# Patient Record
Sex: Male | Born: 1935 | Race: White | Hispanic: No
Health system: Southern US, Community
[De-identification: ages and names within clinical notes are randomized; demographics above are authoritative.]

---

## 2004-11-01 ENCOUNTER — Other Ambulatory Visit: Payer: Self-pay

## 2004-11-01 ENCOUNTER — Observation Stay: Payer: Self-pay | Admitting: Internal Medicine

## 2009-08-01 ENCOUNTER — Ambulatory Visit: Payer: Self-pay | Admitting: Family Medicine

## 2009-08-10 ENCOUNTER — Ambulatory Visit (HOSPITAL_BASED_OUTPATIENT_CLINIC_OR_DEPARTMENT_OTHER): Admission: RE | Admit: 2009-08-10 | Discharge: 2009-08-10 | Payer: Self-pay | Admitting: Orthopedic Surgery

## 2010-06-20 LAB — BASIC METABOLIC PANEL
BUN: 13 mg/dL (ref 6–23)
Chloride: 96 mEq/L (ref 96–112)
Glucose, Bld: 161 mg/dL — ABNORMAL HIGH (ref 70–99)
Potassium: 4.6 mEq/L (ref 3.5–5.1)

## 2010-11-05 IMAGING — US US EXTREM LOW VENOUS*L*
1 series · 17 of 24 positions shown · non-contrast
Comparison: none

REASON FOR EXAM: CR [id number] DR DISNEY CELL L LEG SWELLING EVAL FOR DVT
COMMENTS:

PROCEDURE:     US  - US DOPPLER LOW EXTR LEFT  - August 01, 2009  [DATE]
RESULT:     Technique: Grayscale, duplex, color flow and SPECTRAL waveform
imaging was performed to interrogate the deep venous structures of the right
and left lower extremities.

[Series 1: us extrem low venous*left* · 17 of 30 slices shown]
[im 1/30]
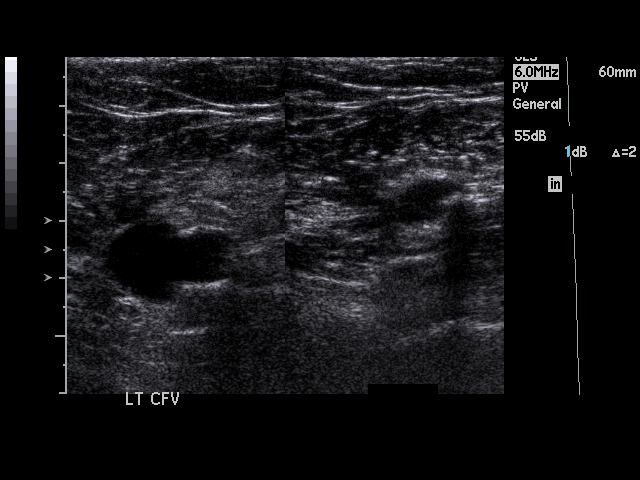
[im 3/30]
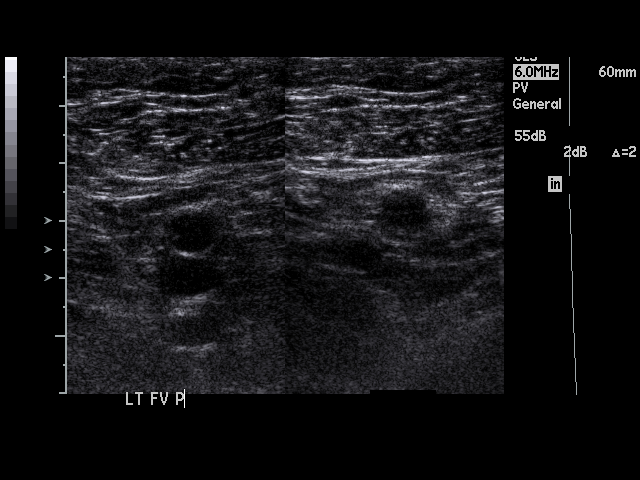
[im 4/30]
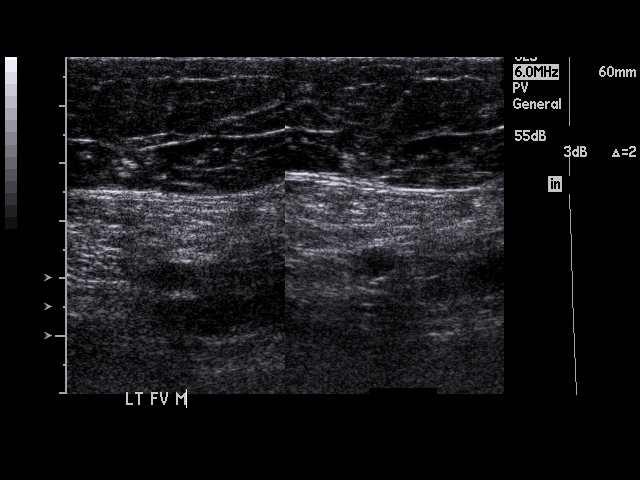
[im 6/30]
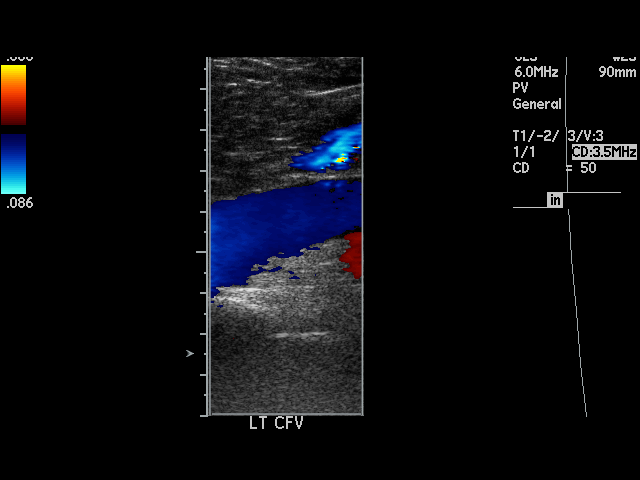
[im 8/30]
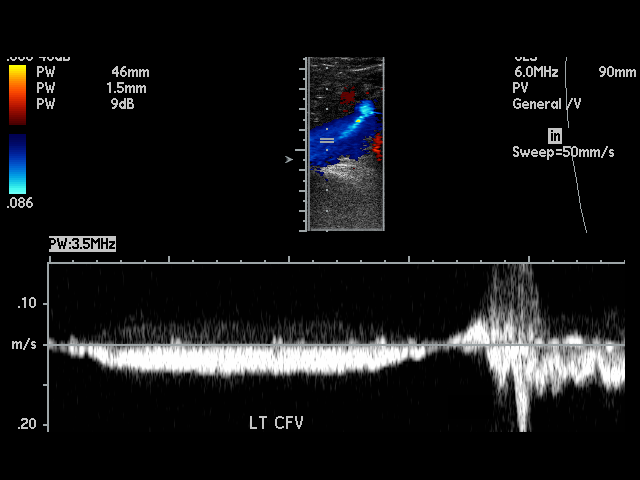
[im 9/30]
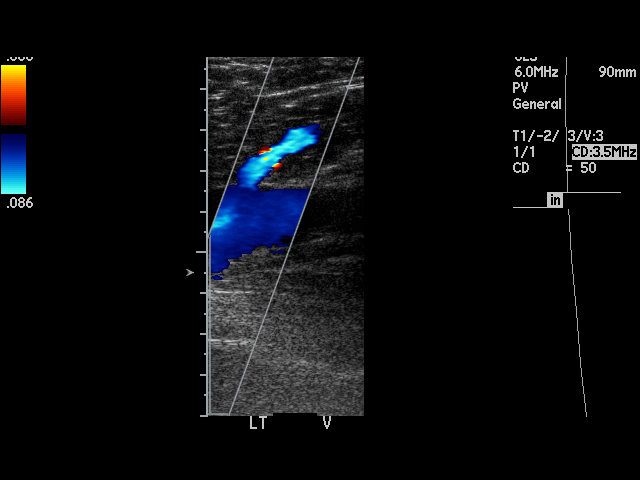
[im 12/30]
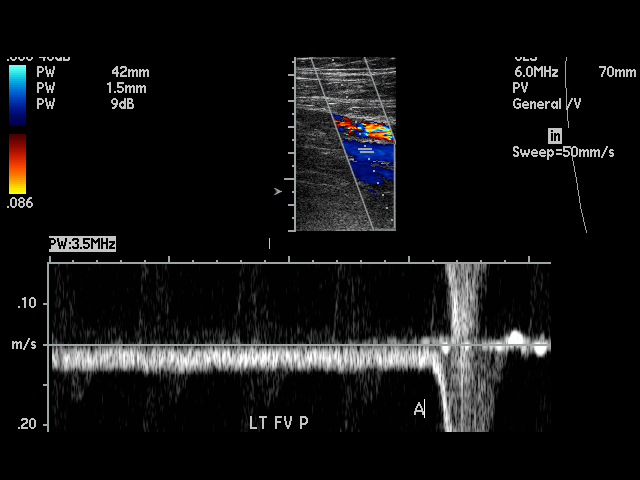
[im 13/30]
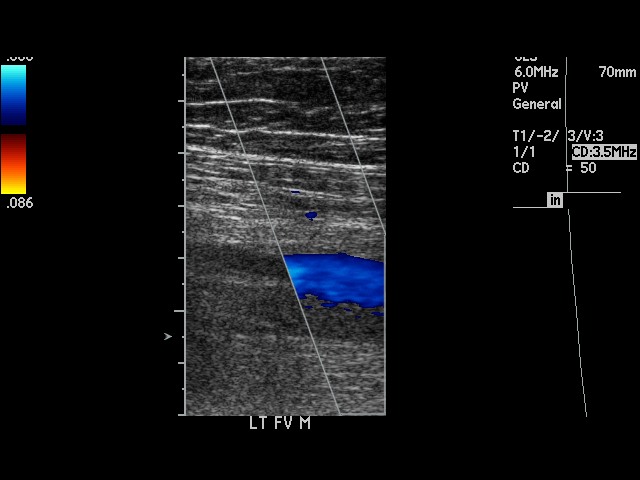
[im 16/30]
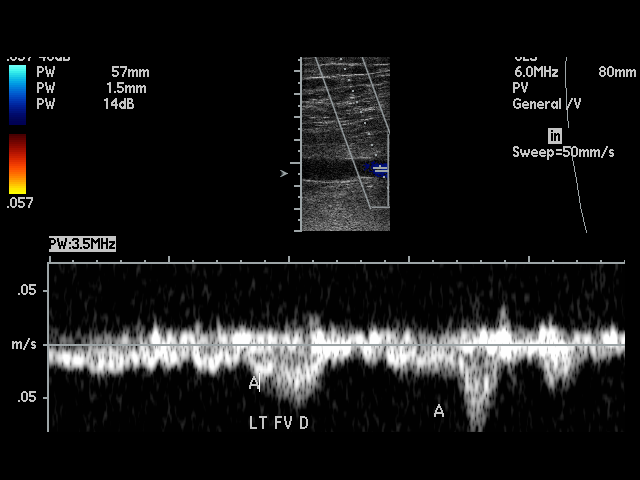
[im 17/30]
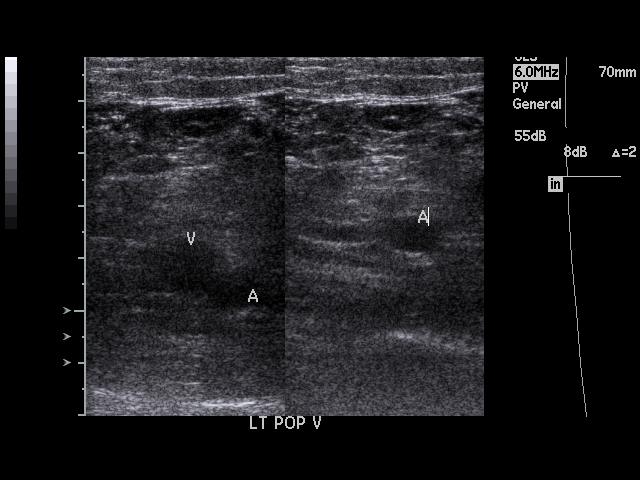
[im 18/30]
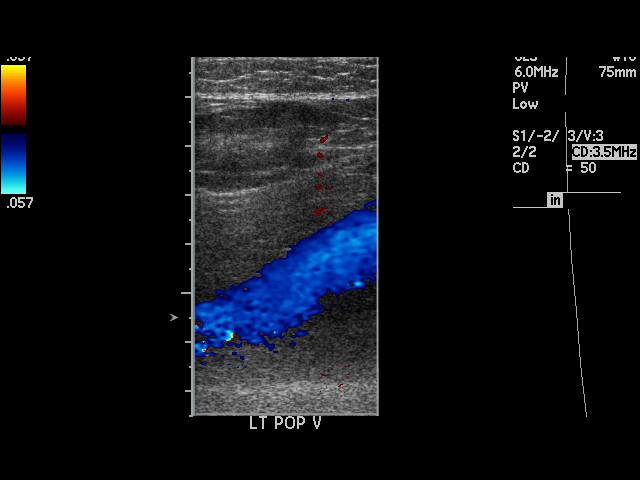
[im 21/30]
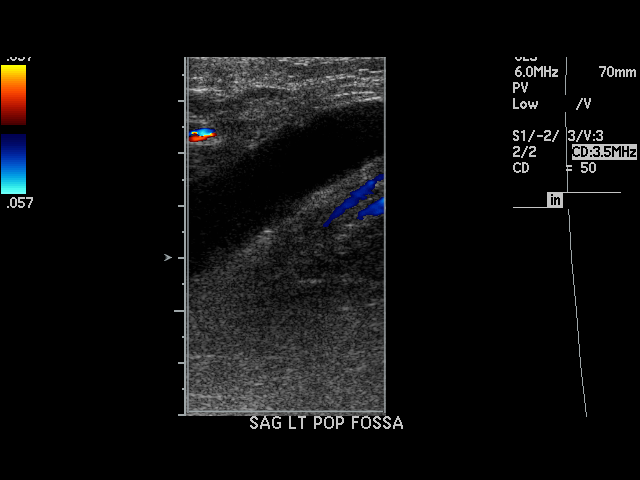
[im 22/30]
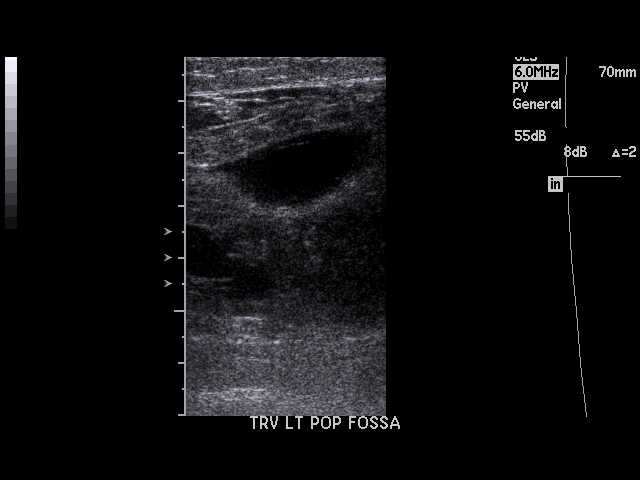
[im 24/30]
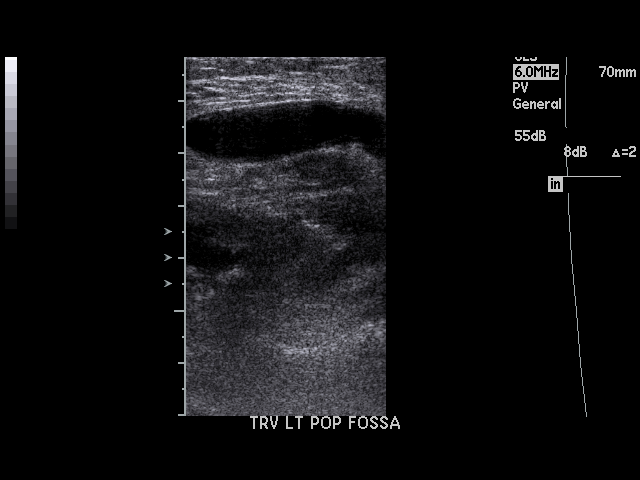
[im 26/30]
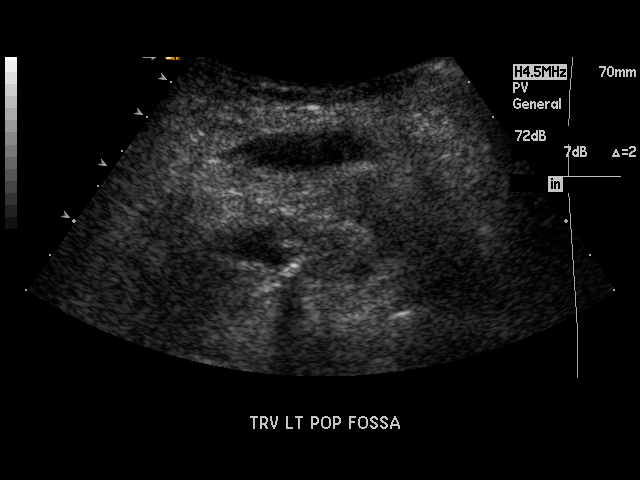
[im 27/30]
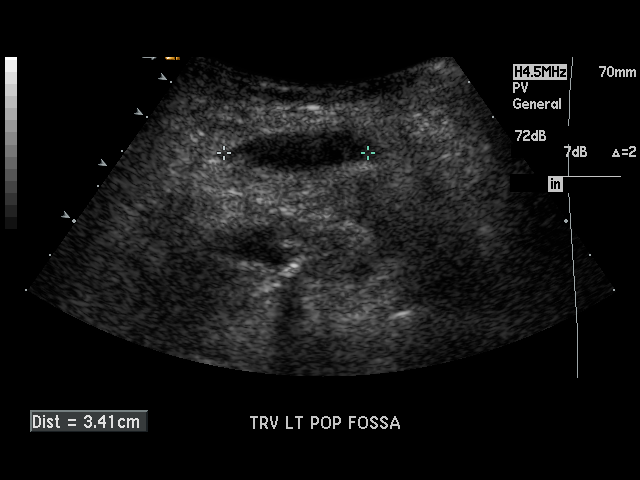
[im 30/30]
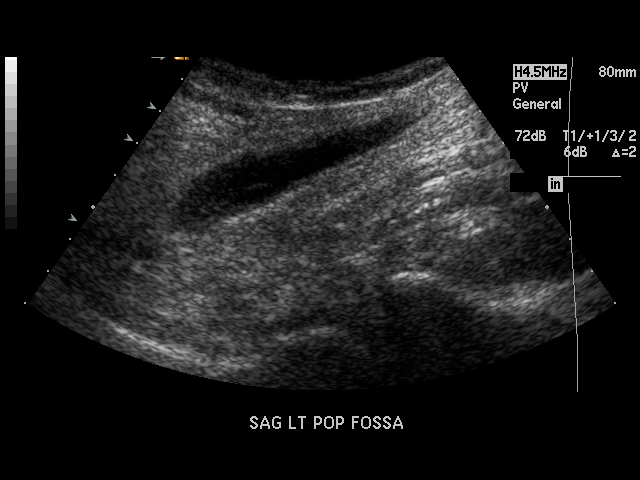

[17 of 24 positions shown; findings below may reference images not displayed]

FINDINGS: There is no evidence of increased echogenicity, non-compressibility,
abnormal waveforms nor abnormal color flow within the interrogated deep
venous structures of the right or left lower extremity. There is an
appropriate response to Valsalva and augmentation.

Left popliteal fossa cyst measuring 6.6 x 3.9 x 1.6 centimeters
IMPRESSION: 1.  No sonographic evidence of a deep venous thrombus within the right or
left lower extremity.

## 2012-05-22 ENCOUNTER — Ambulatory Visit: Payer: Self-pay | Admitting: Specialist

## 2013-01-08 ENCOUNTER — Ambulatory Visit: Payer: Self-pay | Admitting: Family Medicine

## 2014-04-14 IMAGING — CR CERVICAL SPINE - 2-3 VIEW
1 series · 5 of 5 positions shown · non-contrast
Comparison: none

REASON FOR EXAM: Neck pain
COMMENTS:

[Series 1: lat · 0.17mm/px · 5 of 5 slices shown]
[im 1/5]
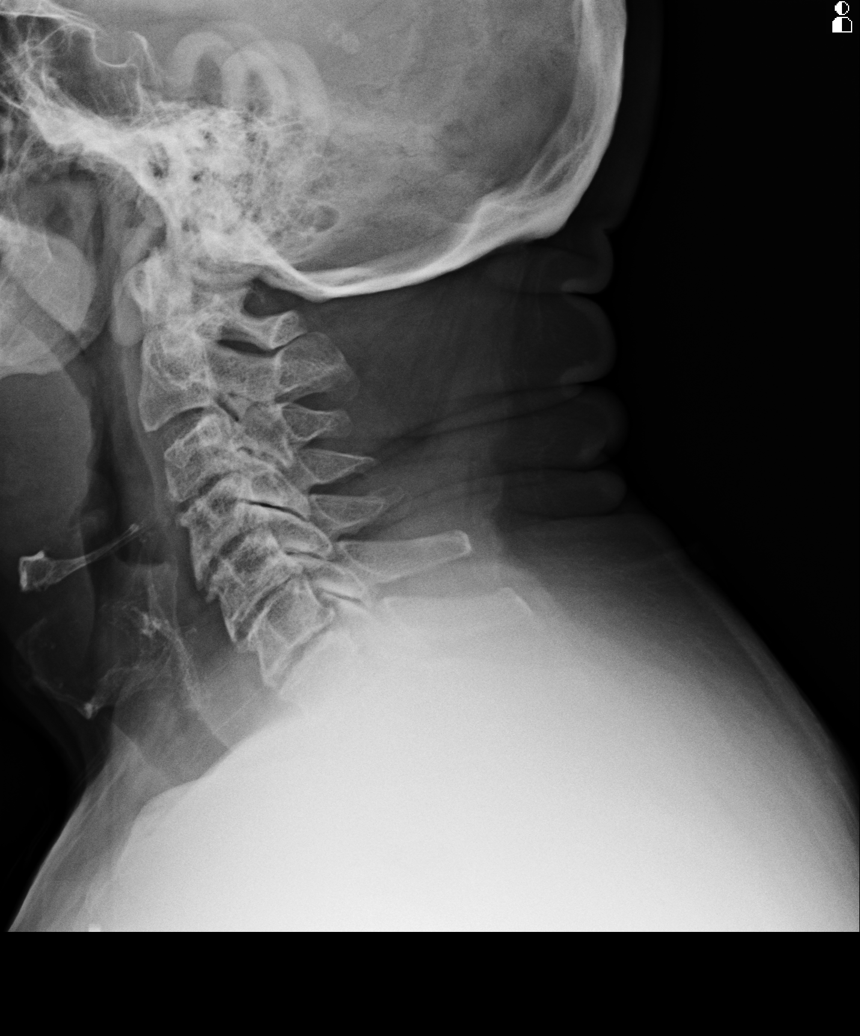
[im 2/5]
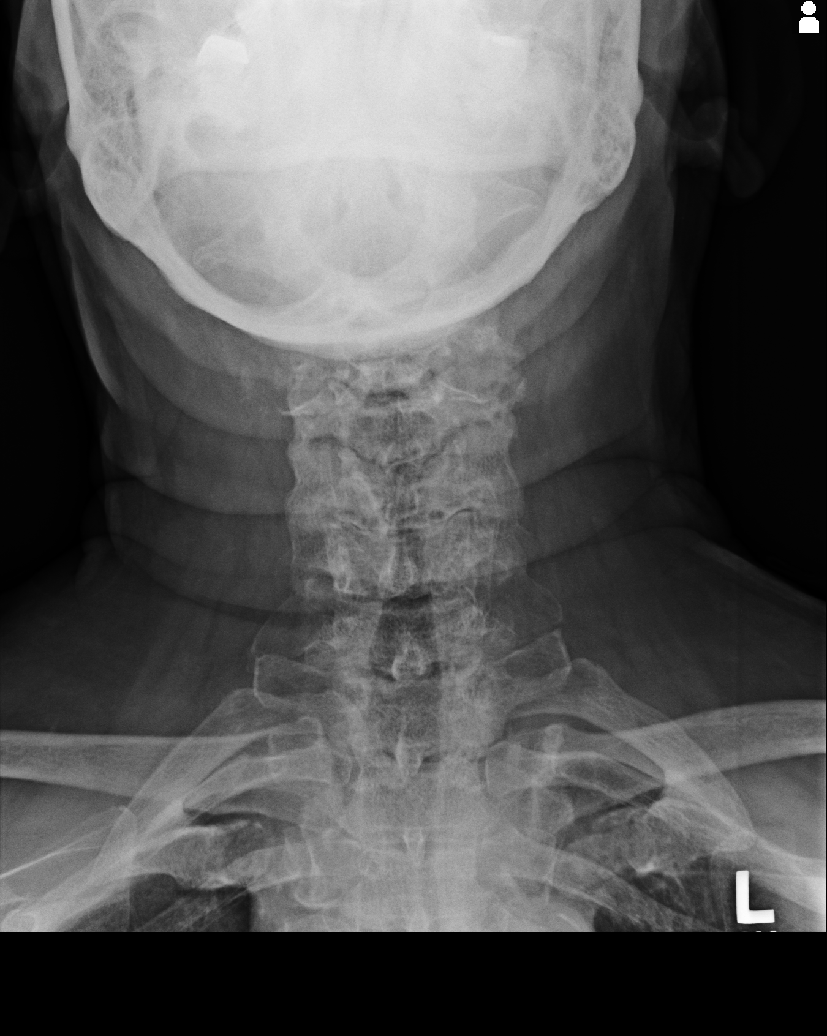
[im 3/5]
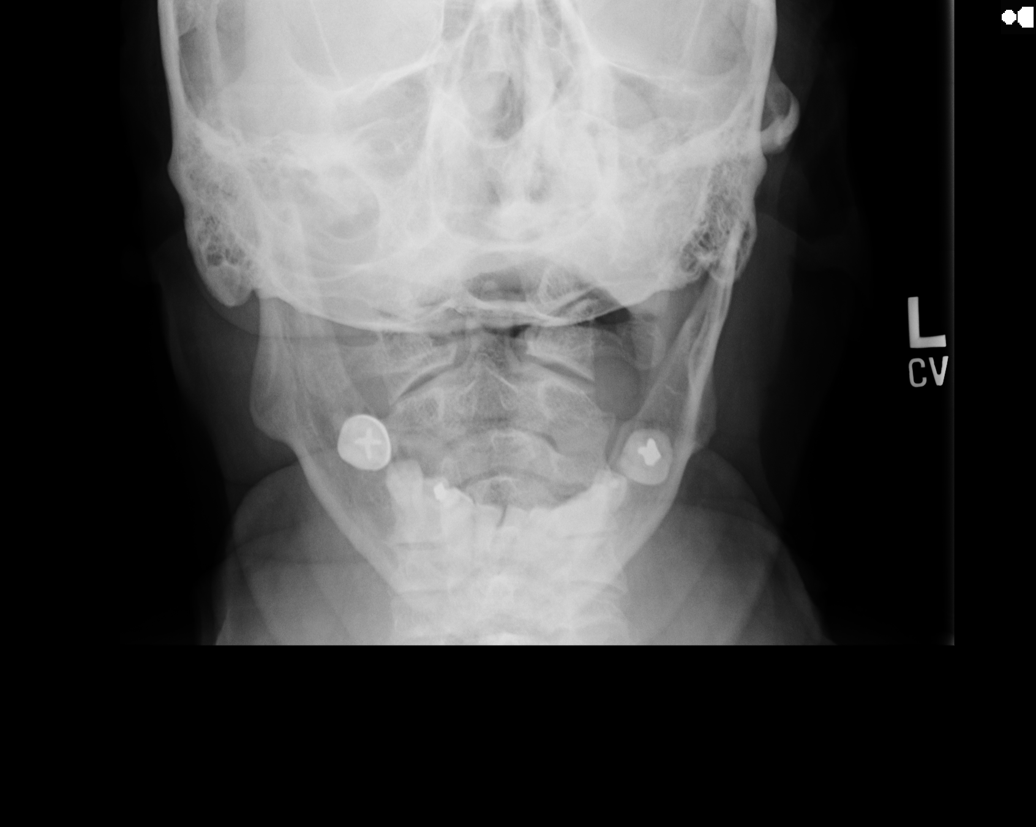
[im 4/5]
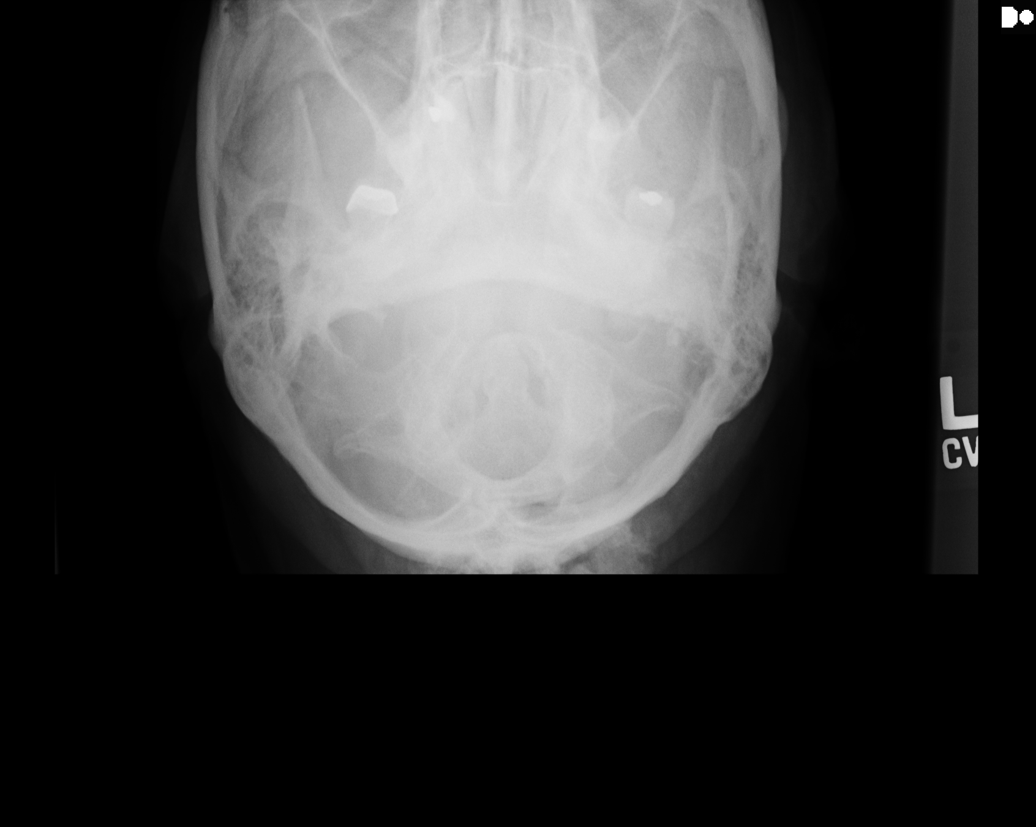
[im 5/5]
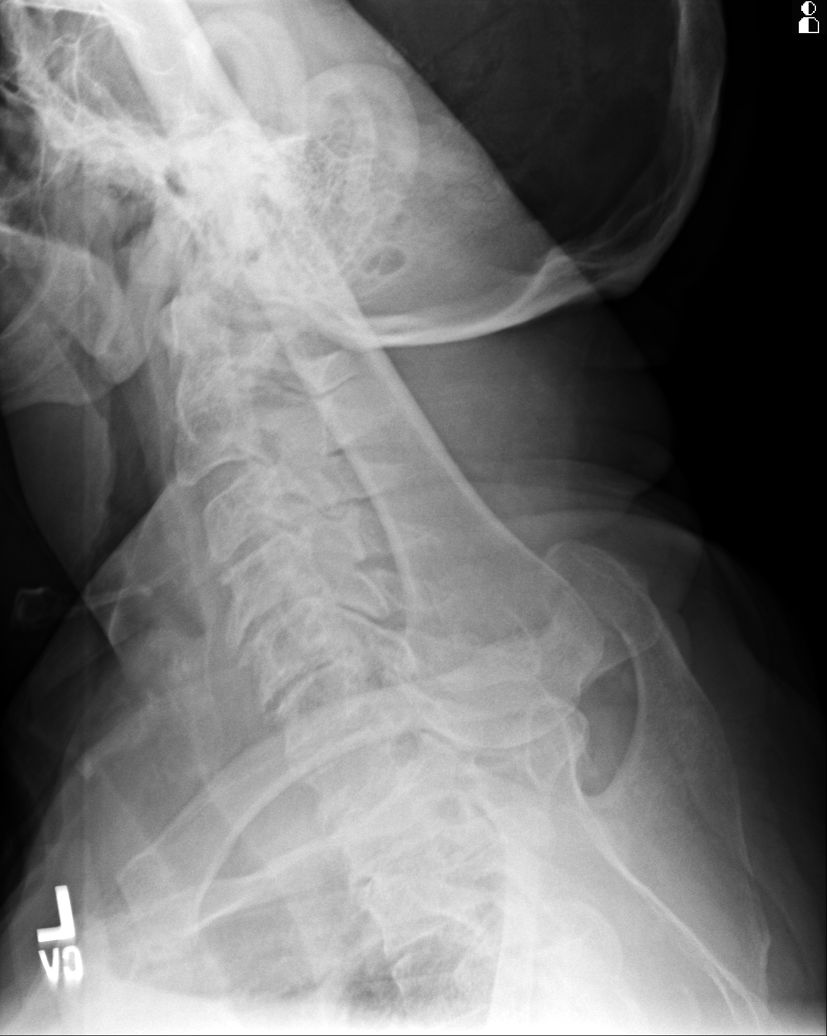

[5 of 5 positions shown; findings below may reference images not displayed]

PROCEDURE:     KDR - KDXR C-SPINE AP AND LATERAL  - January 08, 2013  [DATE]

RESULT:     There is multilevel degenerative disc narrowing from C3 to C7
with degenerative endplate spurring. The alignment is maintained. The
prevertebral soft tissues are normal. Multilevel bilateral facet arthropathy
is present and appears slightly greater on the left. The atlantoaxial
alignment is maintained. The odontoid appears intact.
IMPRESSION: Moderately severe diffuse degenerative change. No acute
bony abnormality evident.

[REDACTED]

## 2014-07-23 NOTE — Op Note (Signed)
PATIENT NAME:  Celine MansCREEDE, Jacoby MR#:  [id number] DATE OF BIRTH:  [birth year]  DATE OF PROCEDURE:  05/22/2012  PREOPERATIVE DIAGNOSIS: Cellulitis.   POSTOPERATIVE DIAGNOSIS: Cellulitis.   PROCEDURES:  1. Ultrasound guidance for vascular access to right cephalic vein.  2. Fluoroscopic guidance for placement of catheter.  3. Insertion of peripherally inserted central venous catheter, 4-French single lumen, right  arm.  SURGEON: Levora DredgeGregory Coumba Kellison, MD  ANESTHESIA: Local.   ESTIMATED BLOOD LOSS: Minimal.   INDICATION FOR PROCEDURE: Requiring antibiotics greater than 5 days.   DESCRIPTION OF PROCEDURE: The patient's right arm was sterilely prepped and draped, and a sterile surgical field was created. The cephalic vein was accessed under direct ultrasound guidance without difficulty with a micropuncture needle and permanent image was recorded. 0.018 wire was then placed into the superior vena cava. Peel-away sheath was placed over the wire. A single lumen peripherally inserted central venous catheter was then placed over the wire and the wire and peel-away sheath were removed. The catheter tip was placed into the superior vena cava and was secured at the skin at 40 cm with a sterile dressing. The catheter withdrew blood well and flushed easily with heparinized saline. The patient tolerated procedure well.  ____________________________ Renford DillsGregory G. Asaiah Hunnicutt, MD ggs:cb D: 05/27/2012 12:18:41 ET T: 05/27/2012 12:29:39 ET JOB#: [id number]  cc: Renford DillsGregory G. Melchizedek Espinola, MD, <Dictator> Renford DillsGREGORY G Kelvin Sennett MD ELECTRONICALLY SIGNED 05/28/2012 8:44

## 2018-12-02 DEATH — deceased
